# Patient Record
Sex: Male | Born: 1995 | Race: Asian | Hispanic: Yes | Marital: Single | Smoking: Current some day smoker
Health system: Southern US, Community
[De-identification: ages and names within clinical notes are randomized; demographics above are authoritative.]

---

## 2016-11-12 ENCOUNTER — Ambulatory Visit (HOSPITAL_COMMUNITY)
Admission: EM | Admit: 2016-11-12 | Discharge: 2016-11-12 | Disposition: A | Discharge status: Home / Self Care | Payer: Worker's Compensation | Attending: Emergency Medicine | Admitting: Emergency Medicine

## 2016-11-12 ENCOUNTER — Encounter (HOSPITAL_COMMUNITY): Payer: Self-pay | Admitting: Emergency Medicine

## 2016-11-12 ENCOUNTER — Ambulatory Visit (INDEPENDENT_AMBULATORY_CARE_PROVIDER_SITE_OTHER): Payer: Worker's Compensation

## 2016-11-12 DIAGNOSIS — S2242XA Multiple fractures of ribs, left side, initial encounter for closed fracture: Secondary | ICD-10-CM

## 2016-11-12 MED ORDER — HYDROCODONE-ACETAMINOPHEN 5-325 MG PO TABS
2.0000 | ORAL_TABLET | Freq: Once | ORAL | Status: AC
  Administered 2016-11-12: 2 via ORAL

## 2016-11-12 MED ORDER — HYDROCODONE-ACETAMINOPHEN 5-325 MG PO TABS
ORAL_TABLET | ORAL | Status: AC
  Filled 2016-11-12: qty 2

## 2016-11-12 MED ORDER — IBUPROFEN 800 MG PO TABS: 800.0000 mg | ORAL_TABLET | Freq: Three times a day (TID) | ORAL | 1 refills | Status: AC

## 2016-11-12 MED ORDER — HYDROCODONE-ACETAMINOPHEN 5-325 MG PO TABS
1.0000 | ORAL_TABLET | Freq: Four times a day (QID) | ORAL | 0 refills | Status: AC | PRN
Start: 2016-11-12 — End: ?

## 2016-11-12 MED ORDER — KETOROLAC TROMETHAMINE 60 MG/2ML IM SOLN
60.0000 mg | Freq: Once | INTRAMUSCULAR | Status: AC
  Administered 2016-11-12: 60 mg via INTRAMUSCULAR

## 2016-11-12 MED ORDER — KETOROLAC TROMETHAMINE 60 MG/2ML IM SOLN
INTRAMUSCULAR | Status: AC
  Filled 2016-11-12: qty 2

## 2016-11-12 NOTE — ED Notes (Signed)
Patient reports he has someone driving for him

## 2016-11-12 NOTE — ED Provider Notes (Signed)
[facility]    CSN: [id number] Arrival date & time: 11/12/16  1743     History   Chief Complaint Chief Complaint  Patient presents with  . Fall    HPI Adam Chen is a 20 y.o. male.   HPI  He is a 20 year old man here for evaluation of left rib pain. He was at work when he fell and landed on a bar. He is having pain in the left lateral ribs. There is an abrasion there at the location of pain. Pain is significantly worse with taking a deep breath. He denies feeling short of breath.  History reviewed. No pertinent past medical history.  There are no active problems to display for this patient.   History reviewed. No pertinent surgical history.     Home Medications    Prior to Admission medications   Medication Sig Start Date End Date Taking? Authorizing Provider  HYDROcodone-acetaminophen (NORCO) 5-325 MG tablet Take 1-2 tablets by mouth every 6 (six) hours as needed for severe pain. 11/12/16   Charm RingsErin J Freyja Govea, MD  ibuprofen (ADVIL,MOTRIN) 800 MG tablet Take 1 tablet (800 mg total) by mouth 3 (three) times daily. 11/12/16   Charm RingsErin J Truong Delcastillo, MD    Family History History reviewed. No pertinent family history.  Social History Social History  Substance Use Topics  . Smoking status: Current Some Day Smoker  . Smokeless tobacco: Never Used  . Alcohol use Yes     Allergies   Patient has no known allergies.   Review of Systems Review of Systems As in history of present illness  Physical Exam Triage Vital Signs ED Triage Vitals [11/12/16 1831]  Enc Vitals Group     BP 142/80     Pulse Rate 101     Resp 20     Temp 98.8 F (37.1 C)     Temp Source Oral     SpO2 100 %     Weight      Height      Head Circumference      Peak Flow      Pain Score      Pain Loc      Pain Edu?      Excl. in GC?    No data found.   Updated Vital Signs BP 142/80 (BP Location: Right Arm)   Pulse 101   Temp 98.8 F (37.1 C) (Oral)   Resp 20   SpO2  100%   Visual Acuity Right Eye Distance:   Left Eye Distance:   Bilateral Distance:    Right Eye Near:   Left Eye Near:    Bilateral Near:     Physical Exam  Constitutional: He appears well-developed and well-nourished. He appears distressed (looks uncomfortable).  Cardiovascular: Normal rate.   Pulmonary/Chest:  Decreased breath sounds in left lung base. Exam limited due to pain with deep breathing.  Musculoskeletal:       Back:     UC Treatments / Results  Labs (all labs ordered are listed, but only abnormal results are displayed) Labs Reviewed - No data to display  EKG  EKG Interpretation None       Radiology Dg Ribs Unilateral W/chest Left  Result Date: 11/12/2016 CLINICAL DATA:  Fall against metal bar with left-sided pain EXAM: LEFT RIBS AND CHEST - 3+ VIEW COMPARISON:  None. FINDINGS: Single-view chest demonstrates no acute infiltrate, or consolidation. Suspect trace left pleural effusion. There are mildly displaced fractures involving the left eighth, ninth,  and tenth ribs. Small amount of subcutaneous emphysema in the left lateral chest wall soft tissues. IMPRESSION: 1. Mildly displaced left eighth through tenth rib fractures. 2. Suspect tiny left pleural effusion. 3. Small amount of left lower lateral chest wall subcutaneous emphysema. Electronically Signed   By: Jasmine PangKim  Fujinaga M.D.   On: 11/12/2016 19:02    Procedures Procedures (including critical care time)  Medications Ordered in UC Medications  ketorolac (TORADOL) injection 60 mg (60 mg Intramuscular Given 11/12/16 1915)  HYDROcodone-acetaminophen (NORCO/VICODIN) 5-325 MG per tablet 2 tablet (2 tablets Oral Given 11/12/16 1915)     Initial Impression / Assessment and Plan / UC Course  I have reviewed the triage vital signs and the nursing notes.  Pertinent labs & imaging results that were available during my care of the patient were reviewed by me and considered in my medical decision making (see  chart for details).  Clinical Course     Abrasion cleaned and bandaged. Ibuprofen and hydrocodone to control pain from broken ribs. Discussed importance of deep breathing to prevent pneumonia. Handout given. Follow-up if pain is not improving in 1 week.  Final Clinical Impressions(s) / UC Diagnoses   Final diagnoses:  Closed fracture of multiple ribs of left side, initial encounter    New Prescriptions New Prescriptions   HYDROCODONE-ACETAMINOPHEN (NORCO) 5-325 MG TABLET    Take 1-2 tablets by mouth every 6 (six) hours as needed for severe pain.   IBUPROFEN (ADVIL,MOTRIN) 800 MG TABLET    Take 1 tablet (800 mg total) by mouth 3 (three) times daily.     Charm RingsErin J Emon Miggins, MD 11/12/16 [phone]

## 2016-11-12 NOTE — ED Triage Notes (Signed)
[facility] a [facility] right side rib pain secondary to a fall that occurred today at work. The patient reported that he feel about 1 foot and landed on a metal bar. The patient reported pain upon inspiration.

## 2016-11-12 NOTE — Discharge Instructions (Signed)
You have several rib fractures. We gave you some medicine here to help with the pain. Take ibuprofen 3 times a day to help with baseline pain. Use the hydrocodone every 4-6 hours as needed for severe pain. It is very important that you take deep breaths periodically throughout the day to prevent pneumonia. Follow-up if pain is not improving in 1 week.

## 2018-03-24 IMAGING — DX DG RIBS W/ CHEST 3+V*L*
4 series · 4 of 4 positions shown · non-contrast
Comparison: None.

CLINICAL DATA: Fall against metal bar with left-sided pain

EXAM:
LEFT RIBS AND CHEST - 3+ VIEW

[chest pa]
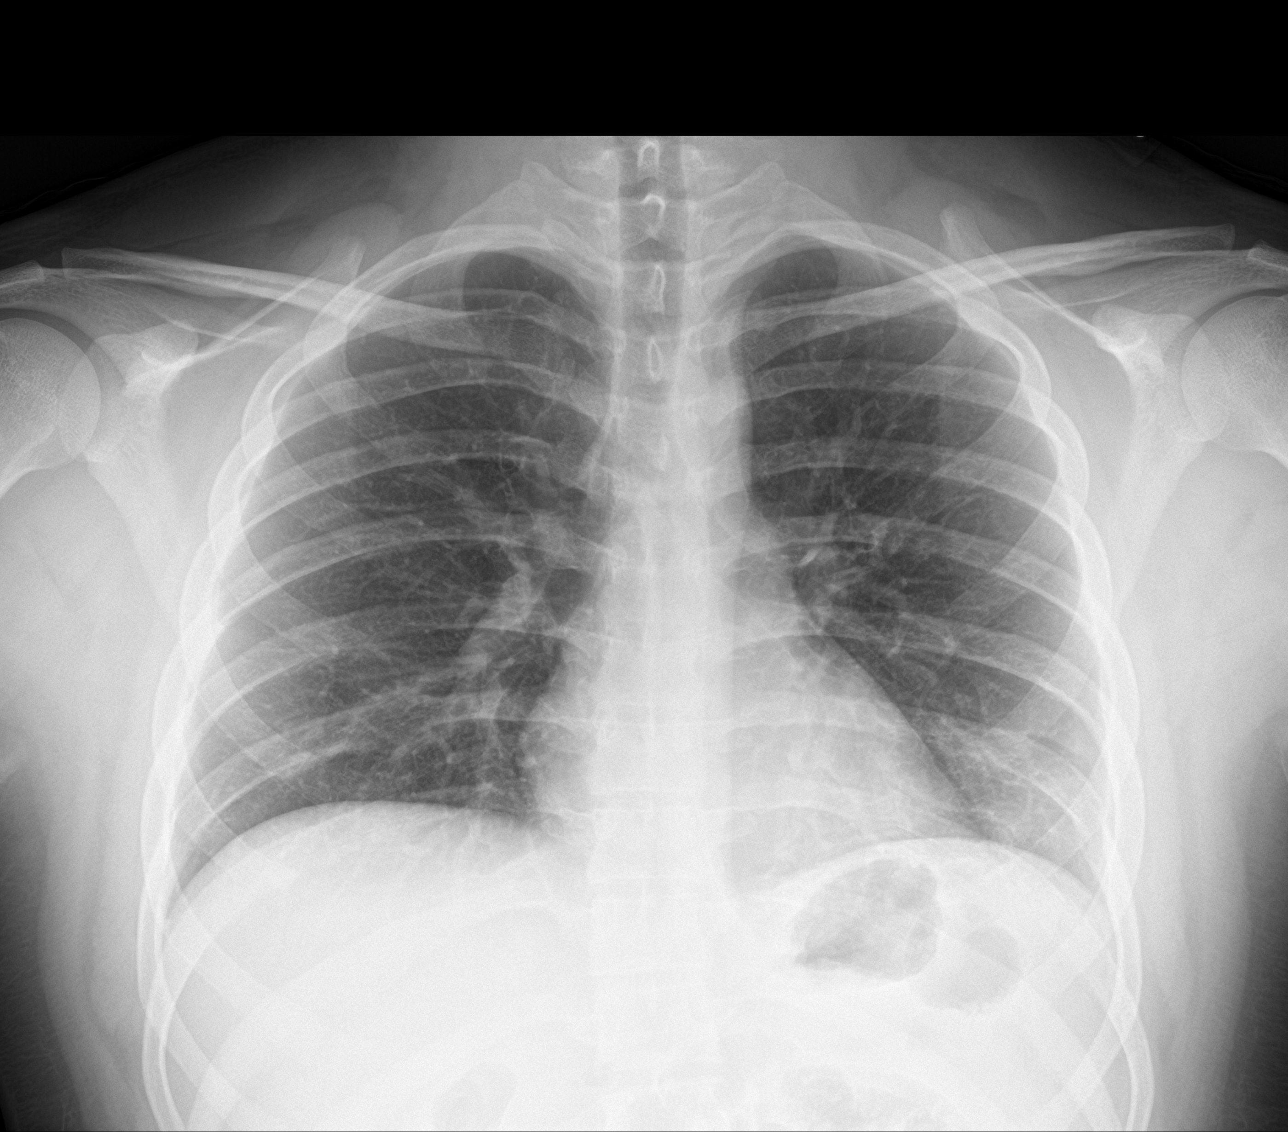

[rib obl]
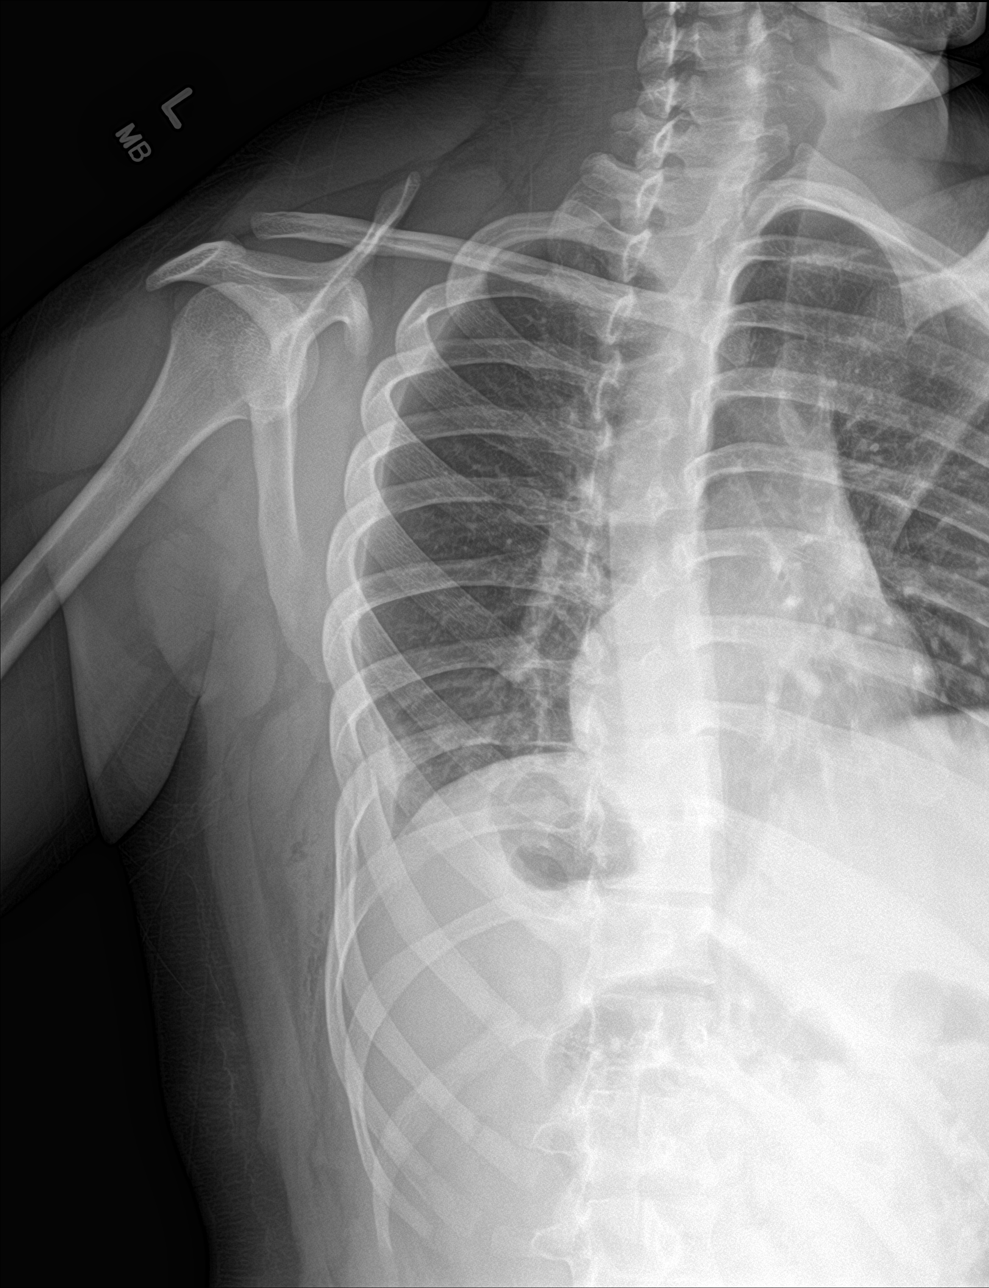

[rib pa]
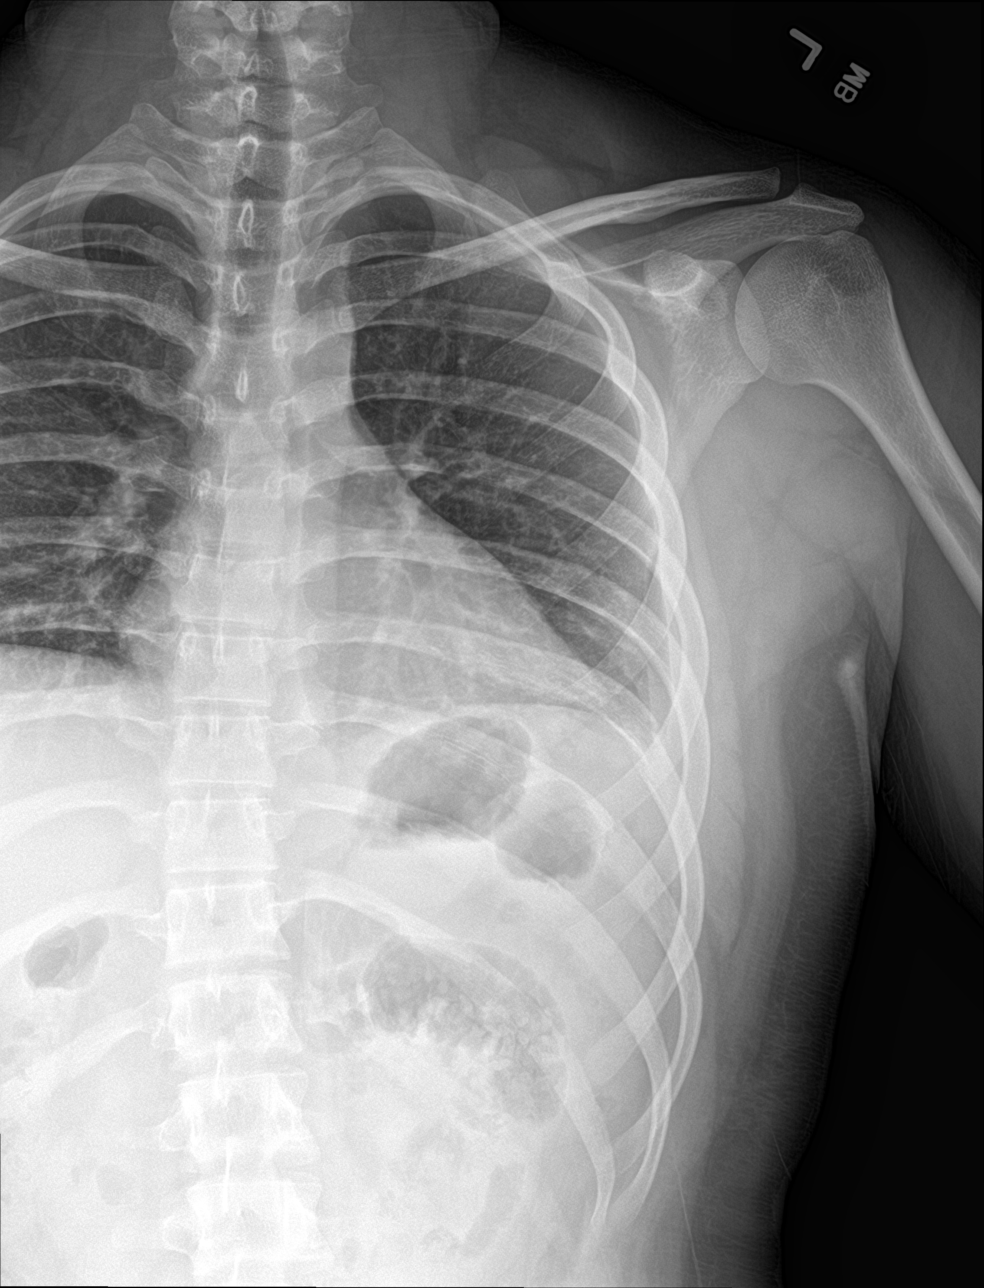

[chest ap]
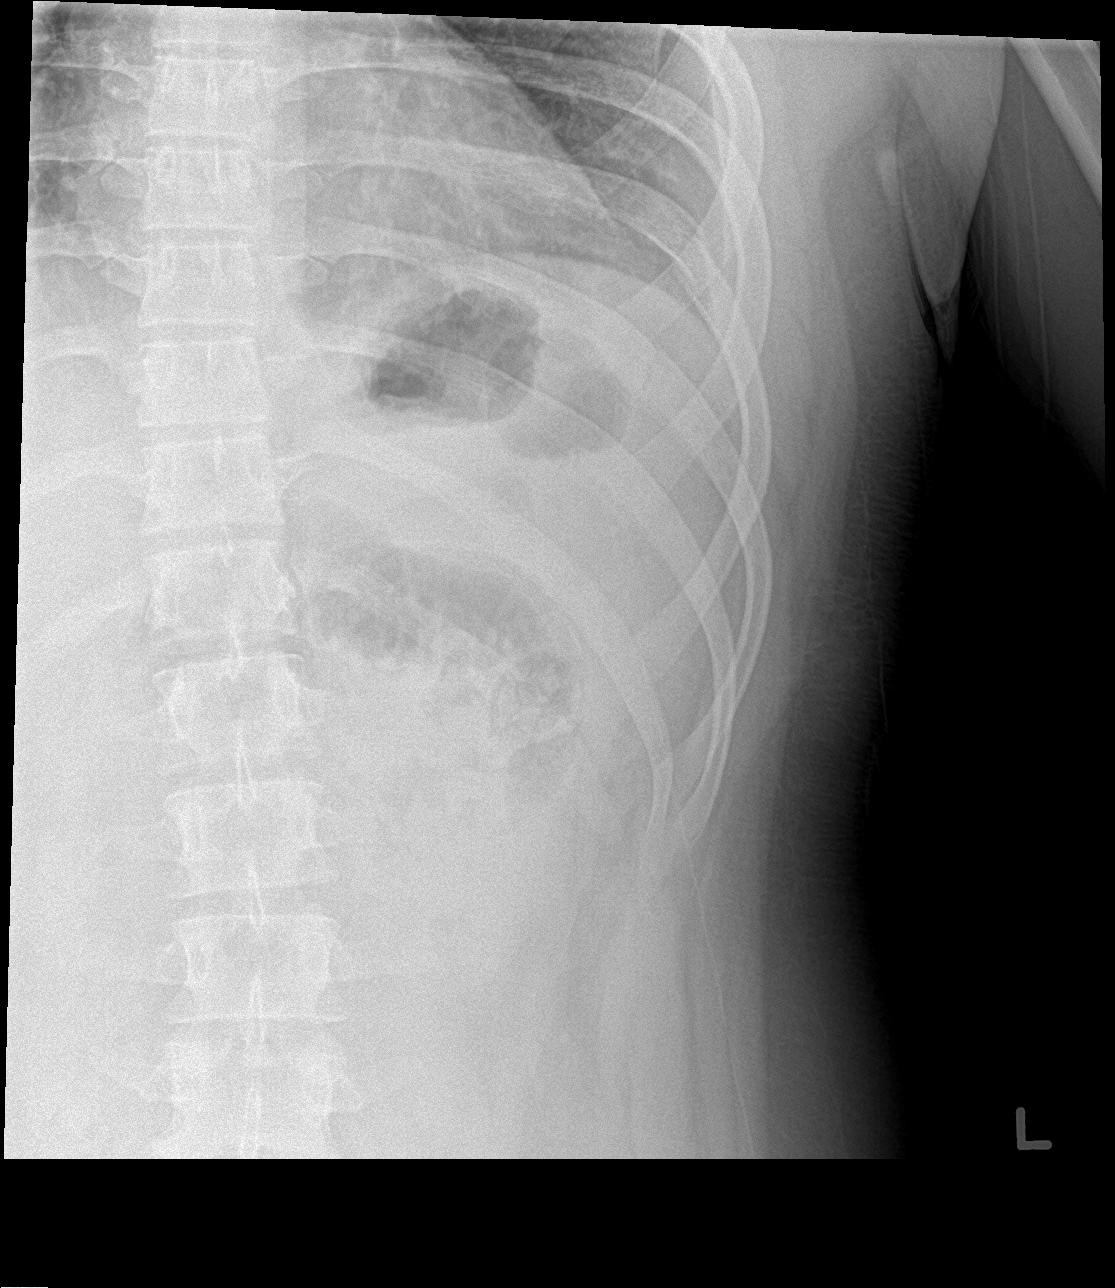

[4 of 4 positions shown; findings below may reference images not displayed]

FINDINGS: Single-view chest demonstrates no acute infiltrate, or
consolidation. Suspect trace left pleural effusion. There are mildly
displaced fractures involving the left eighth, ninth, and tenth
ribs. Small amount of subcutaneous emphysema in the left lateral
chest wall soft tissues.
IMPRESSION: 1. Mildly displaced left eighth through tenth rib fractures.
2. Suspect tiny left pleural effusion.
3. Small amount of left lower lateral chest wall subcutaneous
emphysema.
